# Patient Record
Sex: Female | Born: 1965
Health system: Southern US, Community
[De-identification: ages and names within clinical notes are randomized; demographics above are authoritative.]

## PROBLEM LIST (undated history)

## (undated) DIAGNOSIS — E559 Vitamin D deficiency, unspecified: Secondary | ICD-10-CM

## (undated) DIAGNOSIS — K219 Gastro-esophageal reflux disease without esophagitis: Secondary | ICD-10-CM

## (undated) DIAGNOSIS — E7800 Pure hypercholesterolemia, unspecified: Secondary | ICD-10-CM

## (undated) DIAGNOSIS — K649 Unspecified hemorrhoids: Secondary | ICD-10-CM

## (undated) HISTORY — DX: Pure hypercholesterolemia, unspecified: E78.00

## (undated) HISTORY — PX: COLONOSCOPY: SHX174

## (undated) HISTORY — PX: WISDOM TOOTH EXTRACTION: SHX21

## (undated) HISTORY — DX: Unspecified hemorrhoids: K64.9

## (undated) HISTORY — DX: Vitamin D deficiency, unspecified: E55.9

## (undated) HISTORY — DX: Gastro-esophageal reflux disease without esophagitis: K21.9

---

## 1988-12-21 HISTORY — PX: CERVICAL ABLATION: SHX5771

## 2001-04-20 HISTORY — PX: TUBAL LIGATION: SHX77

## 2015-01-02 ENCOUNTER — Other Ambulatory Visit: Payer: Self-pay

## 2015-01-02 DIAGNOSIS — Z1231 Encounter for screening mammogram for malignant neoplasm of breast: Secondary | ICD-10-CM

## 2015-01-10 ENCOUNTER — Ambulatory Visit
Admission: RE | Admit: 2015-01-10 | Discharge: 2015-01-10 | Disposition: A | Discharge status: Home / Self Care | Payer: BLUE CROSS/BLUE SHIELD | Source: Ambulatory Visit

## 2015-01-10 DIAGNOSIS — Z1231 Encounter for screening mammogram for malignant neoplasm of breast: Secondary | ICD-10-CM

## 2015-01-21 HISTORY — PX: ENDOMETRIAL ABLATION: SHX621

## 2016-07-17 ENCOUNTER — Telehealth: Payer: Self-pay

## 2016-07-17 NOTE — Telephone Encounter (Signed)
Pre VIsit call completed with patient. 

## 2016-07-20 ENCOUNTER — Ambulatory Visit (INDEPENDENT_AMBULATORY_CARE_PROVIDER_SITE_OTHER): Payer: BLUE CROSS/BLUE SHIELD | Admitting: Family Medicine

## 2016-07-20 ENCOUNTER — Encounter: Payer: Self-pay | Admitting: Family Medicine

## 2016-07-20 ENCOUNTER — Other Ambulatory Visit (HOSPITAL_COMMUNITY)
Admission: RE | Admit: 2016-07-20 | Discharge: 2016-07-20 | Disposition: A | Discharge status: Home / Self Care | Payer: BLUE CROSS/BLUE SHIELD | Source: Ambulatory Visit | Attending: Family Medicine | Admitting: Family Medicine

## 2016-07-20 VITALS — BP 121/83 | HR 73 | Temp 97.9°F | Ht 64.25 in | Wt 205.8 lb

## 2016-07-20 DIAGNOSIS — N926 Irregular menstruation, unspecified: Secondary | ICD-10-CM

## 2016-07-20 DIAGNOSIS — Z0000 Encounter for general adult medical examination without abnormal findings: Secondary | ICD-10-CM

## 2016-07-20 DIAGNOSIS — Z01419 Encounter for gynecological examination (general) (routine) without abnormal findings: Secondary | ICD-10-CM | POA: Diagnosis present

## 2016-07-20 DIAGNOSIS — Z131 Encounter for screening for diabetes mellitus: Secondary | ICD-10-CM | POA: Diagnosis not present

## 2016-07-20 DIAGNOSIS — Z13220 Encounter for screening for lipoid disorders: Secondary | ICD-10-CM | POA: Diagnosis not present

## 2016-07-20 DIAGNOSIS — Z124 Encounter for screening for malignant neoplasm of cervix: Secondary | ICD-10-CM

## 2016-07-20 DIAGNOSIS — Z1329 Encounter for screening for other suspected endocrine disorder: Secondary | ICD-10-CM

## 2016-07-20 DIAGNOSIS — Z1151 Encounter for screening for human papillomavirus (HPV): Secondary | ICD-10-CM | POA: Diagnosis present

## 2016-07-20 DIAGNOSIS — Z23 Encounter for immunization: Secondary | ICD-10-CM

## 2016-07-20 DIAGNOSIS — E559 Vitamin D deficiency, unspecified: Secondary | ICD-10-CM | POA: Diagnosis not present

## 2016-07-20 DIAGNOSIS — Z130 Encounter for screening for diseases of the blood and blood-forming organs and certain disorders involving the immune mechanism: Secondary | ICD-10-CM | POA: Diagnosis not present

## 2016-07-20 NOTE — Patient Instructions (Addendum)
It was great to see you today- I will be in touch with your labs and pap asap Your blood pressure looks fine If you like, you can have a mammogram done at the imaging department on the ground floor of the medcenter at your convenience.   Next year you will be due for a screening colonoscopy   If you can, work on losing a few pounds through diet and exercise

## 2016-07-20 NOTE — Progress Notes (Signed)
Pre visit review using our clinic review tool, if applicable. No additional management support is needed unless otherwise documented below in the visit note. 

## 2016-07-20 NOTE — Progress Notes (Signed)
[place] at [facility] [address], [place], [place] [address] [phone]  Date:  07/20/2016   Name:  Rebecca Levy   DOB:  12/14/1966   MRN:  [id number]  PCP:  Rebecca Blinks, MD    Chief Complaint: Establish Care (Pt here to est care. )   History of Present Illness:  Rebecca Levy is a 50 y.o. very pleasant female patient who presents with the following:  Here today to establish care, and also to do a pap.  She had an ablation last year- this has decreased her bleeding although she still does have some light menstrual bleeding.    Last pap was over a year ago.  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  She does not know if she had tdap or Td.  Would like to have tdap today Her last mammo was 12/2014- normal.  Last labs over a year ago.   She is fasting today for labs  History of vitamin D deficiency; she is taking a supplement and would like to check this level She also would like to screen for menopause with an [place] level today   LMP 7/15 There are no active problems to display for this patient.   Past Medical History:  Diagnosis Date  . GERD (gastroesophageal reflux disease)   . High cholesterol   . Vitamin D deficiency     Past Surgical History:  Procedure Laterality Date  . ENDOMETRIAL ABLATION  01/21/2015    Social History  Substance Use Topics  . Smoking status: Not on file  . Smokeless tobacco: Never Used  . Alcohol use Yes     Comment: occ     No family history on file.  No Known Allergies  Medication list has been reviewed and updated.  Current Outpatient Prescriptions on File Prior to Visit  Medication Sig Dispense Refill  . VITAMIN D, ERGOCALCIFEROL, PO Take 5,000 mg by mouth daily.     No current facility-administered medications on file prior to visit.     Review of Systems:  .asepr   Physical Examination: Vitals:   07/20/16 1522  BP: 121/83  Pulse: 73  Temp: 97.9 F (36.6 C)   Vitals:   07/20/16 1522  Weight: 205 lb 12.8 oz (93.4 kg)  Height: 5' 4.25" (1.632 m)   Body mass index is 35.05 kg/m. Ideal Body Weight: Weight in (lb) to have BMI = 25: 146.5  GEN: WDWN, NAD, Non-toxic, A & O x 3, obese, OW looks well HEENT: Atraumatic, Normocephalic. Neck supple. No masses, No LAD.  Bilateral TM wnl, oropharynx normal.  PEERL,EOMI.   Ears and Nose: No external deformity. CV: RRR, No M/G/R. No JVD. No thrill. No extra heart sounds. PULM: CTA B, no wheezes, crackles, rhonchi. No retractions. No resp. distress. No accessory muscle use. ABD: S, NT, ND. No rebound. No HSM. EXTR: No c/c/e NEURO Normal gait.  PSYCH: Normally interactive. Conversant. Not depressed or anxious appearing.  Calm demeanor.  Breast: normal exam, no masses/ dimpling/ discharge Pelvic: normal, no vaginal lesions or discharge. Uterus normal, no CMT, no adnexal tendereness or masses    Assessment and Plan: Physical exam  Screening for cervical cancer - Plan: Cytology - PAP  Screening for deficiency anemia - Plan: CBC  Vitamin D deficiency - Plan: Vitamin D (25 hydroxy)  Screening for diabetes mellitus - Plan: Comprehensive metabolic  panel, Hemoglobin A1c  Menstrual irregularity - Plan: [facility]  Immunization due - Plan: Tdap vaccine greater than or equal to 7yo IM  Screening for thyroid disorder - Plan: TSH  Screening for hyperlipidemia - Plan: Lipid panel  Here today as a new visit for a CPE, pap and labs Update tdap She is overweight but OW looks well today, BP is fine Will plan further follow- up pending labs.    Signed Rebecca Blinks, MD

## 2016-07-21 LAB — COMPREHENSIVE METABOLIC PANEL
ALK PHOS: 65 U/L (ref 39–117)
ALT: 17 U/L (ref 0–35)
AST: 15 U/L (ref 0–37)
Albumin: 4.1 g/dL (ref 3.5–5.2)
BILIRUBIN TOTAL: 1 mg/dL (ref 0.2–1.2)
BUN: 11 mg/dL (ref 6–23)
CALCIUM: 10.6 mg/dL — AB (ref 8.4–10.5)
CO2: 26 meq/L (ref 19–32)
Chloride: 103 mEq/L (ref 96–112)
Creatinine, Ser: 0.8 mg/dL (ref 0.40–1.20)
GFR: 97.72 mL/min (ref >60.00)
Glucose, Bld: 82 mg/dL (ref 70–99)
Potassium: 4 mEq/L (ref 3.5–5.1)
Sodium: 135 mEq/L (ref 135–145)
Total Protein: 7.6 g/dL (ref 6.0–8.3)

## 2016-07-21 LAB — CBC
HCT: 37 % (ref 36.0–46.0)
Hemoglobin: 12.4 g/dL (ref 12.0–15.0)
MCHC: 33.5 g/dL (ref 30.0–36.0)
MCV: 90.6 fl (ref 78.0–100.0)
PLATELETS: 306 10*3/uL (ref 150.0–400.0)
RBC: 4.08 Mil/uL (ref 3.87–5.11)
RDW: 12.9 % (ref 11.5–15.5)
WBC: 8.3 10*3/uL (ref 4.0–10.5)

## 2016-07-21 LAB — FOLLICLE STIMULATING HORMONE: FSH: 15.5 m[IU]/mL

## 2016-07-21 LAB — LIPID PANEL
CHOL/HDL RATIO: 4
Cholesterol: 204 mg/dL — ABNORMAL HIGH (ref 0–200)
HDL: 47.6 mg/dL (ref >39.00)
LDL Cholesterol: 123 mg/dL — ABNORMAL HIGH (ref 0–99)
NonHDL: 156
Triglycerides: 164 mg/dL — ABNORMAL HIGH (ref 0.0–149.0)
VLDL: 32.8 mg/dL (ref 0.0–40.0)

## 2016-07-21 LAB — VITAMIN D 25 HYDROXY (VIT D DEFICIENCY, FRACTURES): VITD: 33.42 ng/mL (ref 30.00–100.00)

## 2016-07-21 LAB — TSH: TSH: 1.66 u[IU]/mL (ref 0.35–4.50)

## 2016-07-21 LAB — HEMOGLOBIN A1C: Hgb A1c MFr Bld: 5.1 % (ref 4.6–6.5)

## 2016-07-22 ENCOUNTER — Encounter: Payer: Self-pay | Admitting: Family Medicine

## 2016-07-22 LAB — CYTOLOGY - PAP

## 2016-12-30 ENCOUNTER — Telehealth: Payer: Self-pay | Admitting: Family Medicine

## 2016-12-30 ENCOUNTER — Other Ambulatory Visit: Payer: Self-pay | Admitting: Family Medicine

## 2016-12-30 DIAGNOSIS — Z1211 Encounter for screening for malignant neoplasm of colon: Secondary | ICD-10-CM

## 2016-12-30 DIAGNOSIS — Z1231 Encounter for screening mammogram for malignant neoplasm of breast: Secondary | ICD-10-CM

## 2016-12-30 NOTE — Telephone Encounter (Signed)
Patient called requesting a referral to a gastroenterologist to et her colonoscopy done. Please advise  Phone: [phone]

## 2016-12-30 NOTE — Telephone Encounter (Signed)
Referral sent 

## 2016-12-30 NOTE — Telephone Encounter (Signed)
Patient aware.

## 2017-01-08 ENCOUNTER — Telehealth: Payer: Self-pay | Admitting: Gastroenterology

## 2017-01-18 ENCOUNTER — Encounter: Payer: Self-pay | Admitting: Gastroenterology

## 2017-01-18 NOTE — Telephone Encounter (Signed)
LM on Vmail to call back to schedule colonoscopy.  Records reviewed and in records reviewed folder

## 2017-02-04 ENCOUNTER — Ambulatory Visit
Admission: RE | Admit: 2017-02-04 | Discharge: 2017-02-04 | Disposition: A | Discharge status: Home / Self Care | Payer: BLUE CROSS/BLUE SHIELD | Source: Ambulatory Visit | Attending: Family Medicine | Admitting: Family Medicine

## 2017-02-04 DIAGNOSIS — Z1231 Encounter for screening mammogram for malignant neoplasm of breast: Secondary | ICD-10-CM

## 2017-02-09 ENCOUNTER — Ambulatory Visit (AMBULATORY_SURGERY_CENTER): Payer: Self-pay

## 2017-02-09 VITALS — Ht 64.5 in | Wt 209.2 lb

## 2017-02-09 DIAGNOSIS — Z800 Family history of malignant neoplasm of digestive organs: Secondary | ICD-10-CM

## 2017-02-09 MED ORDER — NA SULFATE-K SULFATE-MG SULF 17.5-3.13-1.6 GM/177ML PO SOLN: ORAL | 0 refills | Status: DC

## 2017-02-09 NOTE — Progress Notes (Signed)
Per pt, no allergies to soy or egg products.Pt not taking any weight loss meds or using  O2 at home. 

## 2017-02-10 ENCOUNTER — Encounter: Payer: Self-pay | Admitting: Gastroenterology

## 2017-02-15 ENCOUNTER — Telehealth: Payer: Self-pay | Admitting: Gastroenterology

## 2017-02-15 NOTE — Telephone Encounter (Signed)
Called pt with Miralax prep instructions and mailed pt copy [name]/PV

## 2017-02-18 ENCOUNTER — Ambulatory Visit (AMBULATORY_SURGERY_CENTER): Payer: BLUE CROSS/BLUE SHIELD | Admitting: Gastroenterology

## 2017-02-18 ENCOUNTER — Encounter: Payer: Self-pay | Admitting: Gastroenterology

## 2017-02-18 VITALS — BP 133/89 | HR 66 | Temp 98.7°F | Resp 16 | Ht 64.5 in | Wt 209.0 lb

## 2017-02-18 DIAGNOSIS — Z1212 Encounter for screening for malignant neoplasm of rectum: Secondary | ICD-10-CM | POA: Diagnosis not present

## 2017-02-18 DIAGNOSIS — Z1211 Encounter for screening for malignant neoplasm of colon: Secondary | ICD-10-CM

## 2017-02-18 DIAGNOSIS — Z800 Family history of malignant neoplasm of digestive organs: Secondary | ICD-10-CM

## 2017-02-18 DIAGNOSIS — D123 Benign neoplasm of transverse colon: Secondary | ICD-10-CM | POA: Diagnosis not present

## 2017-02-18 MED ORDER — SODIUM CHLORIDE 0.9 % IV SOLN: 500.0000 mL | INTRAVENOUS | Status: DC

## 2017-02-18 NOTE — Progress Notes (Signed)
  [facility] Post-op Note  Patient: Rebecca Levy  Procedure(s) Performed: colonoscopy  Patient Location: [facility] - Recovery Area  Anesthesia Type: Deep Sedation/Propofol  Level of Consciousness: awake, oriented and patient cooperative  Airway and Oxygen Therapy: Patient Spontanous Breathing  Post-op Pain: none  Post-op Assessment:  Post-op Vital signs reviewed, Patient's Cardiovascular Status Stable, Respiratory Function Stable, Patent Airway, No signs of Nausea or vomiting and Pain level controlled  Post-op Vital Signs: Reviewed and stable  Complications: No apparent anesthesia complications  Oluwatobiloba Martin E 1:13 PM

## 2017-02-18 NOTE — Op Note (Signed)
[facility] Patient Name: Rebecca Levy Procedure Date: 02/18/2017 12:39 PM MRN: [id number] Endoscopist: Mauri Pole , MD Age: 51 Referring MD:  Date of Birth: 1966/09/04 Gender: Female Account #: [id number] Procedure:                Colonoscopy Indications:              Screening in patient at increased risk: Colorectal                            cancer in father before age 51, Screening in                            patient at increased risk: Colorectal cancer in                            sister before age 42 Medicines:                Monitored Anesthesia Care Procedure:                Pre-Anesthesia Assessment:                           - Prior to the procedure, a History and Physical                            was performed, and patient medications and                            allergies were reviewed. The patient's tolerance of                            previous anesthesia was also reviewed. The risks                            and benefits of the procedure and the sedation                            options and risks were discussed with the patient.                            All questions were answered, and informed consent                            was obtained. Prior Anticoagulants: The patient has                            taken no previous anticoagulant or antiplatelet                            agents. ASA Grade Assessment: II - A patient with                            mild systemic disease. After reviewing the risks  and benefits, the patient was deemed in                            satisfactory condition to undergo the procedure.                           After obtaining informed consent, the colonoscope                            was passed under direct vision. Throughout the                            procedure, the patient's blood pressure, pulse, and                            oxygen saturations were monitored  continuously. The                            Model CF-HQ190L [id number]) scope was introduced                            through the anus and advanced to the the cecum,                            identified by appendiceal orifice and ileocecal                            valve. The colonoscopy was performed without                            difficulty. The patient tolerated the procedure                            well. The quality of the bowel preparation was                            excellent. The ileocecal valve, appendiceal                            orifice, and rectum were photographed. Scope In: 12:43:42 PM Scope Out: 1:06:37 PM Scope Withdrawal Time: 0 hours 15 minutes 9 seconds  Total Procedure Duration: 0 hours 22 minutes 55 seconds  Findings:                 The perianal and digital rectal examinations were                            normal.                           A 15 mm polyp was found in the transverse colon.                            The polyp was semi-pedunculated. The polyp was  removed with a hot snare. Resection and retrieval                            were complete.                           The exam was otherwise without abnormality. Complications:            No immediate complications. Estimated Blood Loss:     Estimated blood loss: none. Impression:               - One 15 mm polyp in the transverse colon, removed                            with a hot snare. Resected and retrieved.                           - The examination was otherwise normal. Recommendation:           - Patient has a contact number available for                            emergencies. The signs and symptoms of potential                            delayed complications were discussed with the                            patient. Return to normal activities tomorrow.                            Written discharge instructions were provided to the                             patient.                           - Resume previous diet.                           - Continue present medications.                           - No aspirin, ibuprofen, naproxen, or other                            non-steroidal anti-inflammatory drugs.                           - Await pathology results.                           - Repeat colonoscopy in 3 years for surveillance                            based on pathology results.                           -  Return to GI clinic PRN. Mauri Pole, MD 02/18/2017 1:12:11 PM This report has been signed electronically.

## 2017-02-18 NOTE — Progress Notes (Signed)
Called to room to assist during endoscopic procedure.  Patient ID and intended procedure confirmed with present staff. Received instructions for my participation in the procedure from the performing physician.  

## 2017-02-18 NOTE — Patient Instructions (Signed)
  No aspirin, ibuprofen, advil, aleve, naprosyn, any NSAID medication  Handout given for Polyps   YOU HAD AN ENDOSCOPIC PROCEDURE TODAY AT [facility]:   Refer to the procedure report that was given to you for any specific questions about what was found during the examination.  If the procedure report does not answer your questions, please call your gastroenterologist to clarify.  If you requested that your care partner not be given the details of your procedure findings, then the procedure report has been included in a sealed envelope for you to review at your convenience later.  YOU SHOULD EXPECT: Some feelings of bloating in the abdomen. Passage of more gas than usual.  Walking can help get rid of the air that was put into your GI tract during the procedure and reduce the bloating. If you had a lower endoscopy (such as a colonoscopy or flexible sigmoidoscopy) you may notice spotting of blood in your stool or on the toilet paper. If you underwent a bowel prep for your procedure, you may not have a normal bowel movement for a few days.  Please Note:  You might notice some irritation and congestion in your nose or some drainage.  This is from the oxygen used during your procedure.  There is no need for concern and it should clear up in a day or so.  SYMPTOMS TO REPORT IMMEDIATELY:   Following lower endoscopy (colonoscopy or flexible sigmoidoscopy):  Excessive amounts of blood in the stool  Significant tenderness or worsening of abdominal pains  Swelling of the abdomen that is new, acute  Fever of 100F or higher   For urgent or emergent issues, a gastroenterologist can be reached at any hour by calling [phone].   DIET:  We do recommend a small meal at first, but then you may proceed to your regular diet.  Drink plenty of fluids but you should avoid alcoholic beverages for 24 hours.  ACTIVITY:  You should plan to take it easy for the rest of today and you should NOT  DRIVE or use heavy machinery until tomorrow (because of the sedation medicines used during the test).    FOLLOW UP: Our staff will call the number listed on your records the next business day following your procedure to check on you and address any questions or concerns that you may have regarding the information given to you following your procedure. If we do not reach you, we will leave a message.  However, if you are feeling well and you are not experiencing any problems, there is no need to return our call.  We will assume that you have returned to your regular daily activities without incident.  If any biopsies were taken you will be contacted by phone or by letter within the next 1-3 weeks.  Please call us at [phone] if you have not heard about the biopsies in 3 weeks.    SIGNATURES/CONFIDENTIALITY: You and/or your care partner have signed paperwork which will be entered into your electronic medical record.  These signatures attest to the fact that that the information above on your After Visit Summary has been reviewed and is understood.  Full responsibility of the confidentiality of this discharge information lies with you and/or your care-partner.

## 2017-02-19 ENCOUNTER — Telehealth: Payer: Self-pay | Admitting: *Deleted

## 2017-02-19 NOTE — Telephone Encounter (Signed)
  Follow up Call-  Call back number 02/18/2017  Post procedure Call Back phone  # [phone]  Permission to leave phone message Yes     Patient questions:  Do you have a fever, pain , or abdominal swelling? No. Pain Score  0 *  Have you tolerated food without any problems? Yes.    Have you been able to return to your normal activities? Yes.    Do you have any questions about your discharge instructions: Diet   No. Medications  No. Follow up visit  No.  Do you have questions or concerns about your Care? No.  Actions: * If pain score is 4 or above: No action needed, pain <4.

## 2017-02-25 ENCOUNTER — Encounter: Payer: Self-pay | Admitting: Gastroenterology

## 2017-04-29 ENCOUNTER — Ambulatory Visit (HOSPITAL_BASED_OUTPATIENT_CLINIC_OR_DEPARTMENT_OTHER)
Admission: RE | Admit: 2017-04-29 | Discharge: 2017-04-29 | Disposition: A | Discharge status: Home / Self Care | Payer: BLUE CROSS/BLUE SHIELD | Source: Ambulatory Visit | Attending: Family Medicine | Admitting: Family Medicine

## 2017-04-29 ENCOUNTER — Ambulatory Visit (INDEPENDENT_AMBULATORY_CARE_PROVIDER_SITE_OTHER): Payer: BLUE CROSS/BLUE SHIELD | Admitting: Family Medicine

## 2017-04-29 VITALS — BP 126/86 | HR 76 | Temp 97.8°F | Ht 64.25 in | Wt 213.6 lb

## 2017-04-29 DIAGNOSIS — Z130 Encounter for screening for diseases of the blood and blood-forming organs and certain disorders involving the immune mechanism: Secondary | ICD-10-CM

## 2017-04-29 DIAGNOSIS — Z131 Encounter for screening for diabetes mellitus: Secondary | ICD-10-CM | POA: Diagnosis not present

## 2017-04-29 DIAGNOSIS — M545 Low back pain: Secondary | ICD-10-CM | POA: Insufficient documentation

## 2017-04-29 DIAGNOSIS — M47816 Spondylosis without myelopathy or radiculopathy, lumbar region: Secondary | ICD-10-CM | POA: Insufficient documentation

## 2017-04-29 DIAGNOSIS — Z1329 Encounter for screening for other suspected endocrine disorder: Secondary | ICD-10-CM

## 2017-04-29 DIAGNOSIS — Z13220 Encounter for screening for lipoid disorders: Secondary | ICD-10-CM

## 2017-04-29 DIAGNOSIS — N912 Amenorrhea, unspecified: Secondary | ICD-10-CM

## 2017-04-29 LAB — LIPID PANEL
CHOL/HDL RATIO: 5
Cholesterol: 186 mg/dL (ref 0–200)
HDL: 39.1 mg/dL (ref >39.00)
LDL Cholesterol: 110 mg/dL — ABNORMAL HIGH (ref 0–99)
NONHDL: 146.89
TRIGLYCERIDES: 182 mg/dL — AB (ref 0.0–149.0)
VLDL: 36.4 mg/dL (ref 0.0–40.0)

## 2017-04-29 LAB — POC URINALSYSI DIPSTICK (AUTOMATED)
BILIRUBIN UA: NEGATIVE
GLUCOSE UA: NEGATIVE
Ketones, UA: NEGATIVE
Leukocytes, UA: NEGATIVE
Nitrite, UA: NEGATIVE
Protein, UA: NEGATIVE
RBC UA: NEGATIVE
Spec Grav, UA: 1.015 (ref 1.010–1.025)
Urobilinogen, UA: 0.2 E.U./dL
pH, UA: 6 (ref 5.0–8.0)

## 2017-04-29 LAB — COMPREHENSIVE METABOLIC PANEL
ALK PHOS: 67 U/L (ref 39–117)
ALT: 37 U/L — ABNORMAL HIGH (ref 0–35)
AST: 25 U/L (ref 0–37)
Albumin: 3.8 g/dL (ref 3.5–5.2)
BILIRUBIN TOTAL: 0.7 mg/dL (ref 0.2–1.2)
BUN: 13 mg/dL (ref 6–23)
CO2: 28 mEq/L (ref 19–32)
CREATININE: 0.72 mg/dL (ref 0.40–1.20)
Calcium: 10.7 mg/dL — ABNORMAL HIGH (ref 8.4–10.5)
Chloride: 105 mEq/L (ref 96–112)
GFR: 110.01 mL/min (ref >60.00)
GLUCOSE: 81 mg/dL (ref 70–99)
Potassium: 4.1 mEq/L (ref 3.5–5.1)
Sodium: 136 mEq/L (ref 135–145)
Total Protein: 6.8 g/dL (ref 6.0–8.3)

## 2017-04-29 LAB — CBC
HCT: 33.3 % — ABNORMAL LOW (ref 36.0–46.0)
Hemoglobin: 11 g/dL — ABNORMAL LOW (ref 12.0–15.0)
MCHC: 33.1 g/dL (ref 30.0–36.0)
MCV: 93.2 fl (ref 78.0–100.0)
Platelets: 305 10*3/uL (ref 150.0–400.0)
RBC: 3.58 Mil/uL — ABNORMAL LOW (ref 3.87–5.11)
RDW: 13.9 % (ref 11.5–15.5)
WBC: 8.5 10*3/uL (ref 4.0–10.5)

## 2017-04-29 LAB — HEMOGLOBIN A1C: HEMOGLOBIN A1C: 5.2 % (ref 4.6–6.5)

## 2017-04-29 LAB — TSH: TSH: 1.62 u[IU]/mL (ref 0.35–4.50)

## 2017-04-29 LAB — POCT URINE PREGNANCY: PREG TEST UR: NEGATIVE

## 2017-04-29 MED ORDER — METHOCARBAMOL 500 MG PO TABS: 500.0000 mg | ORAL_TABLET | Freq: Three times a day (TID) | ORAL | 0 refills | Status: DC | PRN

## 2017-04-29 NOTE — Progress Notes (Addendum)
Rebecca Levy at [facility] [address] [place], [place] [address] [phone]  Date:  04/29/2017   Name:  Rebecca Levy   DOB:  04/29/1966   MRN:  [id number]  PCP:  Darreld Mclean, MD    Chief Complaint: Back Pain (c/o lower back pain that is noticed more with certain movements x 1 month and is has been getting worse over the last few days. Pt also noticed she has had increased urination for the past month. )   History of Present Illness:  Rebecca Levy is a 51 y.o. very pleasant female patient who presents with the following:  Here today for an acute visit- last seen by myself in July of 2017  Here today to establish care, and also to do a pap.  She had an ablation last year- this has decreased her bleeding although she still does have some light menstrual bleeding.    Last pap was over a year ago.  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  She does not know if she had tdap or Td.  Would like to have tdap today Her last mammo was 12/2014- normal.  Last labs over a year ago.   She is fasting today for labs  History of vitamin D deficiency; she is taking a supplement and would like to check this level She also would like to screen for menopause with an [place] level today  Today she notes a problem with lower back pain for about one month.  It has been worse for the last week or so. She works as a CNA/ medication aid.  Her job is physically active but is not aware of any particular injury. They got a new medicaiton cart which is heavier than the old one so this may have contributed. The pain can radiatte into her left thigh and buttock a bit.  No leg weakness or numbness of her legs. She has tried ibuprofen/ tylenol as needed but does not think it helps too much She has never had any significant back issues in the past   She has not noted any bowel or bladder control  issues She has noted more frequent urination for a month or so however  No menses in about 6 months- she wonders if she is going through menopause She did have an ablation about 2 years ago but continued to menstruate until 6 months ago   There are no active problems to display for this patient.   Past Medical History:  Diagnosis Date  . GERD (gastroesophageal reflux disease)   . Hemorrhoids   . High cholesterol   . Vitamin D deficiency     Past Surgical History:  Procedure Laterality Date  . CERVICAL ABLATION  1990  . COLONOSCOPY    . ENDOMETRIAL ABLATION  01/21/2015  . TUBAL LIGATION  04/20/2001  . WISDOM TOOTH EXTRACTION      Social History  Substance Use Topics  . Smoking status: Never Smoker  . Smokeless tobacco: Never Used  . Alcohol use Yes     Comment: occ     Family History  Problem Relation Age of Onset  . Colon cancer Father        died age 26 due to colon cancer  . Hypertension Father   . Hyperlipidemia Father   . Colon cancer Sister  died age 37 due to colon cancer  . Ovarian cancer Maternal Aunt   . Breast cancer Maternal Aunt   . Breast cancer Maternal Aunt     No Known Allergies  Medication list has been reviewed and updated.  Current Outpatient Prescriptions on File Prior to Visit  Medication Sig Dispense Refill  . Omega-3 Fatty Acids (FISH OIL) 1000 MG CAPS Take by mouth daily.    Marland Kitchen VITAMIN D, ERGOCALCIFEROL, PO Take 1,000 mg by mouth daily.      Current Facility-Administered Medications on File Prior to Visit  Medication Dose Route Frequency Provider Last Rate Last Dose  . 0.9 %  sodium chloride infusion  500 mL Intravenous Continuous Nandigam, Venia Minks, MD        Review of Systems:  As per HPI- otherwise negative.   Physical Examination: Vitals:   04/29/17 1009  BP: 126/86  Pulse: 76  Temp: 97.8 F (36.6 C)   Vitals:   04/29/17 1009  Weight: 213 lb 9.6 oz (96.9 kg)  Height: 5' 4.25" (1.632 m)   Body mass index  is 36.38 kg/m. Ideal Body Weight: Weight in (lb) to have BMI = 25: 146.5  GEN: WDWN, NAD, Non-toxic, A & O x 3, obese, otherwise looks well HEENT: Atraumatic, Normocephalic. Neck supple. No masses, No LAD. Ears and Nose: No external deformity. CV: RRR, No M/G/R. No JVD. No thrill. No extra heart sounds. PULM: CTA B, no wheezes, crackles, rhonchi. No retractions. No resp. distress. No accessory muscle use. EXTR: No c/c/e NEURO Normal gait.  PSYCH: Normally interactive. Conversant. Not depressed or anxious appearing.  Calm demeanor.  She indicates pain in her bilateral lower back.  Normal BLE strength, sensation, DTR and negative SLR.  However lumbar flexion is slightly decreased, extension is normal   Results for orders placed or performed in visit on 04/29/17  CBC  Result Value Ref Range   WBC 8.5 4.0 - 10.5 K/uL   RBC 3.58 (L) 3.87 - 5.11 Mil/uL   Platelets 305.0 150.0 - 400.0 K/uL   Hemoglobin 11.0 (L) 12.0 - 15.0 g/dL   HCT 33.3 (L) 36.0 - 46.0 %   MCV 93.2 78.0 - 100.0 fl   MCHC 33.1 30.0 - 36.0 g/dL   RDW 13.9 11.5 - 15.5 %  Comprehensive metabolic panel  Result Value Ref Range   Sodium 136 135 - 145 mEq/L   Potassium 4.1 3.5 - 5.1 mEq/L   Chloride 105 96 - 112 mEq/L   CO2 28 19 - 32 mEq/L   Glucose, Bld 81 70 - 99 mg/dL   BUN 13 6 - 23 mg/dL   Creatinine, Ser 0.72 0.40 - 1.20 mg/dL   Total Bilirubin 0.7 0.2 - 1.2 mg/dL   Alkaline Phosphatase 67 39 - 117 U/L   AST 25 0 - 37 U/L   ALT 37 (H) 0 - 35 U/L   Total Protein 6.8 6.0 - 8.3 g/dL   Albumin 3.8 3.5 - 5.2 g/dL   Calcium 10.7 (H) 8.4 - 10.5 mg/dL   GFR 110.01 >60.00 mL/min  TSH  Result Value Ref Range   TSH 1.62 0.35 - 4.50 uIU/mL  Lipid panel  Result Value Ref Range   Cholesterol 186 0 - 200 mg/dL   Triglycerides 182.0 (H) 0.0 - 149.0 mg/dL   HDL 39.10 >39.00 mg/dL   VLDL 36.4 0.0 - 40.0 mg/dL   LDL Cholesterol 110 (H) 0 - 99 mg/dL   Total CHOL/HDL Ratio 5  NonHDL 146.89   Hemoglobin A1c  Result Value  Ref Range   Hgb A1c MFr Bld 5.2 4.6 - 6.5 %  POCT Urinalysis Dipstick (Automated)  Result Value Ref Range   Color, UA Yellow    Clarity, UA Clear    Glucose, UA negative    Bilirubin, UA negative    Ketones, UA negative    Spec Grav, UA 1.015 1.010 - 1.025   Blood, UA negative    pH, UA 6.0 5.0 - 8.0   Protein, UA negative    Urobilinogen, UA 0.2 0.2 or 1.0 E.U./dL   Nitrite, UA negative    Leukocytes, UA Negative Negative  POCT urine pregnancy  Result Value Ref Range   Preg Test, Ur Negative Negative    Assessment and Plan: Acute low back pain, unspecified back pain laterality, with sciatica presence unspecified - Plan: POCT Urinalysis Dipstick (Automated), DG Lumbar Spine Complete, methocarbamol (ROBAXIN) 500 MG tablet  Amenorrhea - Plan: POCT urine pregnancy  Screening for thyroid disorder - Plan: TSH  Screening for diabetes mellitus - Plan: Comprehensive metabolic panel, Hemoglobin A1c  Screening for hyperlipidemia - Plan: Lipid panel  Screening for deficiency anemia - Plan: CBC  Here today with lower back pain . Likely due to overuse and deconditioning.  Encouraged her to work more exercise into her life, esp core strength and she will try Note to be OOW for a few days, robaxin to use as needed She is fasting today so will obtain labs as above She is seeing me for a CPE in a couple of months  Her lumbar film reports are not up yet, but are benign to my read    Signed Lamar Blinks, MD  Received labs- letter to pt, ordered PTH and Calcium level as a future lab for her

## 2017-04-29 NOTE — Patient Instructions (Addendum)
It was good to see you today- we will get your labs for you today so you do NOT need to fast prior to your physical later this summer I will be in touch with your x-ray report asap In the meantime, try the robaxin (muscle relaxer) as needed for your back pain.  This may cause you to feel sleepy so keep this in mind!  Also, you can continue to use ibuprofen/ tylenol as needed  Let me know if your back is not feeling better soon!  Work on getting into shape and strengthening your back and core- this will help keep your back from hurting as you get older

## 2017-04-29 NOTE — Addendum Note (Signed)
Addended by: Lamar Blinks C on: 04/29/2017 09:19 PM   Modules accepted: Orders

## 2017-05-06 ENCOUNTER — Telehealth: Payer: Self-pay | Admitting: Family Medicine

## 2017-05-06 NOTE — Telephone Encounter (Signed)
Labs and x-rays mailed to pt several days ago Called and [facility]- results should arrive soon, let me know if not. Signing up for mychart is the fastest way to get labs

## 2017-05-06 NOTE — Telephone Encounter (Signed)
Relation to ZJ:QBHA Call back number:[phone]  Reason for call:  Patient inquiring about imaging/lab results,please advise

## 2017-07-20 NOTE — Progress Notes (Addendum)
[facility] [address], [place], [place] [address] [phone]  Date:  07/22/2017   Name:  Rebecca Levy   DOB:  02-Mar-1966   MRN:  [id number]  PCP:  Darreld Mclean, MD    Chief Complaint: Annual Exam (Pt here for CPE. Last PAP: 06/2016, pt would like to have a PAP today. Last mammogram: 01/2017. Up to date on tetanus vaccine. )   History of Present Illness:  Rebecca Levy is a 51 y.o. very pleasant female patient who presents with the following:  Here today for a CPE History of GERD, hyperlipidemia, vitamin D def currently on OTC supplement Her last pap was in 2017- pt prefers to repeat this today She is perimenopausal- last year her menses started to space out. She recently went 10 months without any menses but then had another period.  Mammogram in February colonoscopy is UTD She is feeling well overall, but is working 2 jobs and has a hard time losing weight.  She hopes to start exercising more  Wt Readings from Last 3 Encounters:  07/22/17 214 lb 12.8 oz (97.4 kg)  04/29/17 213 lb 9.6 oz (96.9 kg)  02/18/17 209 lb (94.8 kg)   We did get labs for her in May of this year- she needs to have a repeat calcium and PTH level today,and I will also repeat her blood count She does give blood on a regular basis which is probably why she can be anemic  There are no active problems to display for this patient.   Past Medical History:  Diagnosis Date  . GERD (gastroesophageal reflux disease)   . Hemorrhoids   . High cholesterol   . Vitamin D deficiency     Past Surgical History:  Procedure Laterality Date  . CERVICAL ABLATION  1990  . COLONOSCOPY    . ENDOMETRIAL ABLATION  01/21/2015  . TUBAL LIGATION  04/20/2001  . WISDOM TOOTH EXTRACTION      Social History  Substance Use Topics  . Smoking status: Never Smoker  . Smokeless tobacco: Never Used  . Alcohol use Yes     Comment: occ     Family History   Problem Relation Age of Onset  . Colon cancer Father        died age 8 due to colon cancer  . Hypertension Father   . Hyperlipidemia Father   . Colon cancer Sister        died age 61 due to colon cancer  . Ovarian cancer Maternal Aunt   . Breast cancer Maternal Aunt   . Breast cancer Maternal Aunt     No Known Allergies  Medication list has been reviewed and updated.  Current Outpatient Prescriptions on File Prior to Visit  Medication Sig Dispense Refill  . Multiple Vitamins-Minerals (CENTRUM SILVER 50+WOMEN PO) Take 1 tablet by mouth daily.    . Omega-3 Fatty Acids (FISH OIL) 1000 MG CAPS Take by mouth daily.    Marland Kitchen VITAMIN D, ERGOCALCIFEROL, PO Take 1,000 mg by mouth daily.      No current facility-administered medications on file prior to visit.     Review of Systems:  As per HPI- otherwise negative. No fever or chills No SOB or CP   Physical Examination: Vitals:   07/22/17 1059  BP: 134/74  Pulse: 70  Temp: 98.1 F (36.7 C)   Vitals:   07/22/17 1059  Weight: 214 lb 12.8 oz (97.4  kg)  Height: 5' 4.25" (1.632 m)   Body mass index is 36.58 kg/m. Ideal Body Weight: Weight in (lb) to have BMI = 25: 146.5  GEN: WDWN, NAD, Non-toxic, A & O x 3, looks well HEENT: Atraumatic, Normocephalic. Neck supple. No masses, No LAD. Bilateral TM wnl, oropharynx normal.  PEERL,EOMI.   Ears and Nose: No external deformity. CV: RRR, No M/G/R. No JVD. No thrill. No extra heart sounds. PULM: CTA B, no wheezes, crackles, rhonchi. No retractions. No resp. distress. No accessory muscle use. ABD: S, NT, ND, +BS. No rebound. No HSM. EXTR: No c/c/e NEURO Normal gait.  PSYCH: Normally interactive. Conversant. Not depressed or anxious appearing.  Calm demeanor.    Assessment and Plan: Physical exam  Screening for cervical cancer - Plan: Cytology - PAP  Serum calcium elevated - Plan: PTH, Intact and Calcium, Ambulatory referral to Endocrinology  Blood donor - Plan: CBC  Here  today for a CPE Pap today Repeat calcium and do PTH Cbc today Discussed menopause- report if any bleeding after no bleeding for a year  Signed Lamar Blinks, MD  Received her labs- called and [facility]  Results for orders placed or performed in visit on 07/22/17  CBC  Result Value Ref Range   WBC 7.3 4.0 - 10.5 K/uL   RBC 3.91 3.87 - 5.11 Mil/uL   Platelets 298.0 150.0 - 400.0 K/uL   Hemoglobin 11.7 (L) 12.0 - 15.0 g/dL   HCT 35.9 (L) 36.0 - 46.0 %   MCV 91.9 78.0 - 100.0 fl   MCHC 32.5 30.0 - 36.0 g/dL   RDW 13.5 11.5 - 15.5 %  PTH, Intact and Calcium  Result Value Ref Range   PTH 65 (H) 14 - 64 pg/mL   Calcium 10.6 (H) 8.6 - 10.4 mg/dL  Cytology - PAP  Result Value Ref Range   Adequacy      Satisfactory for evaluation  endocervical/transformation zone component ABSENT.   Diagnosis      NEGATIVE FOR INTRAEPITHELIAL LESIONS OR MALIGNANCY.   Material Submitted CervicoVaginal Pap [ThinPrep Imaged]    Her anemia is better, hb close to normal It does appear that she may have mild hyperparathyroidism- will make referral to endocrinology for her to help determine if she is truly hyperparathyroid or not  Received her pap on 8/7- letter to pt with all her labs

## 2017-07-22 ENCOUNTER — Other Ambulatory Visit (HOSPITAL_COMMUNITY)
Admission: RE | Admit: 2017-07-22 | Discharge: 2017-07-22 | Disposition: A | Discharge status: Home / Self Care | Payer: BLUE CROSS/BLUE SHIELD | Source: Ambulatory Visit | Attending: Family Medicine | Admitting: Family Medicine

## 2017-07-22 ENCOUNTER — Ambulatory Visit (INDEPENDENT_AMBULATORY_CARE_PROVIDER_SITE_OTHER): Payer: BLUE CROSS/BLUE SHIELD | Admitting: Family Medicine

## 2017-07-22 VITALS — BP 134/74 | HR 70 | Temp 98.1°F | Ht 64.25 in | Wt 214.8 lb

## 2017-07-22 DIAGNOSIS — Z0000 Encounter for general adult medical examination without abnormal findings: Secondary | ICD-10-CM

## 2017-07-22 DIAGNOSIS — Z52008 Unspecified donor, other blood: Secondary | ICD-10-CM | POA: Diagnosis not present

## 2017-07-22 DIAGNOSIS — Z01419 Encounter for gynecological examination (general) (routine) without abnormal findings: Secondary | ICD-10-CM | POA: Insufficient documentation

## 2017-07-22 DIAGNOSIS — Z124 Encounter for screening for malignant neoplasm of cervix: Secondary | ICD-10-CM | POA: Diagnosis not present

## 2017-07-22 LAB — CBC
HCT: 35.9 % — ABNORMAL LOW (ref 36.0–46.0)
HEMOGLOBIN: 11.7 g/dL — AB (ref 12.0–15.0)
MCHC: 32.5 g/dL (ref 30.0–36.0)
MCV: 91.9 fl (ref 78.0–100.0)
Platelets: 298 10*3/uL (ref 150.0–400.0)
RBC: 3.91 Mil/uL (ref 3.87–5.11)
RDW: 13.5 % (ref 11.5–15.5)
WBC: 7.3 10*3/uL (ref 4.0–10.5)

## 2017-07-22 NOTE — Patient Instructions (Signed)
It was great to see you today!  I will be in touch with your pap and labs asap - we will make sure that your calcium is back to normal   Health Maintenance for Postmenopausal Women Menopause is a normal process in which your reproductive ability comes to an end. This process happens gradually over a span of months to years, usually between the ages of 64 and 45. Menopause is complete when you have missed 12 consecutive menstrual periods. It is important to talk with your health care provider about some of the most common conditions that affect postmenopausal women, such as heart disease, cancer, and bone loss (osteoporosis). Adopting a healthy lifestyle and getting preventive care can help to promote your health and wellness. Those actions can also lower your chances of developing some of these common conditions. What should I know about menopause? During menopause, you may experience a number of symptoms, such as:  Moderate-to-severe hot flashes.  Night sweats.  Decrease in sex drive.  Mood swings.  Headaches.  Tiredness.  Irritability.  Memory problems.  Insomnia.  Choosing to treat or not to treat menopausal changes is an individual decision that you make with your health care provider. What should I know about hormone replacement therapy and supplements? Hormone therapy products are effective for treating symptoms that are associated with menopause, such as hot flashes and night sweats. Hormone replacement carries certain risks, especially as you become older. If you are thinking about using estrogen or estrogen with progestin treatments, discuss the benefits and risks with your health care provider. What should I know about heart disease and stroke? Heart disease, heart attack, and stroke become more likely as you age. This may be due, in part, to the hormonal changes that your body experiences during menopause. These can affect how your body processes dietary fats, triglycerides,  and cholesterol. Heart attack and stroke are both medical emergencies. There are many things that you can do to help prevent heart disease and stroke:  Have your blood pressure checked at least every 1-2 years. High blood pressure causes heart disease and increases the risk of stroke.  If you are 86-80 years old, ask your health care provider if you should take aspirin to prevent a heart attack or a stroke.  Do not use any tobacco products, including cigarettes, chewing tobacco, or electronic cigarettes. If you need help quitting, ask your health care provider.  It is important to eat a healthy diet and maintain a healthy weight. ? Be sure to include plenty of vegetables, fruits, low-fat dairy products, and lean protein. ? Avoid eating foods that are high in solid fats, added sugars, or salt (sodium).  Get regular exercise. This is one of the most important things that you can do for your health. ? Try to exercise for at least 150 minutes each week. The type of exercise that you do should increase your heart rate and make you sweat. This is known as moderate-intensity exercise. ? Try to do strengthening exercises at least twice each week. Do these in addition to the moderate-intensity exercise.  Know your numbers.Ask your health care provider to check your cholesterol and your blood glucose. Continue to have your blood tested as directed by your health care provider.  What should I know about cancer screening? There are several types of cancer. Take the following steps to reduce your risk and to catch any cancer development as early as possible. Breast Cancer  Practice breast self-awareness. ? This means understanding  how your breasts normally appear and feel. ? It also means doing regular breast self-exams. Let your health care provider know about any changes, no matter how small.  If you are 42 or older, have a clinician do a breast exam (clinical breast exam or CBE) every year.  Depending on your age, family history, and medical history, it may be recommended that you also have a yearly breast X-ray (mammogram).  If you have a family history of breast cancer, talk with your health care provider about genetic screening.  If you are at high risk for breast cancer, talk with your health care provider about having an MRI and a mammogram every year.  Breast cancer (BRCA) gene test is recommended for women who have family members with BRCA-related cancers. Results of the assessment will determine the need for genetic counseling and BRCA1 and for BRCA2 testing. BRCA-related cancers include these types: ? Breast. This occurs in males or females. ? Ovarian. ? Tubal. This may also be called fallopian tube cancer. ? Cancer of the abdominal or pelvic lining (peritoneal cancer). ? Prostate. ? Pancreatic.  Cervical, Uterine, and Ovarian Cancer Your health care provider may recommend that you be screened regularly for cancer of the pelvic organs. These include your ovaries, uterus, and vagina. This screening involves a pelvic exam, which includes checking for microscopic changes to the surface of your cervix (Pap test).  For women ages 21-65, health care providers may recommend a pelvic exam and a Pap test every three years. For women ages 29-65, they may recommend the Pap test and pelvic exam, combined with testing for human papilloma virus (HPV), every five years. Some types of HPV increase your risk of cervical cancer. Testing for HPV may also be done on women of any age who have unclear Pap test results.  Other health care providers may not recommend any screening for nonpregnant women who are considered low risk for pelvic cancer and have no symptoms. Ask your health care provider if a screening pelvic exam is right for you.  If you have had past treatment for cervical cancer or a condition that could lead to cancer, you need Pap tests and screening for cancer for at least 20  years after your treatment. If Pap tests have been discontinued for you, your risk factors (such as having a new sexual partner) need to be reassessed to determine if you should start having screenings again. Some women have medical problems that increase the chance of getting cervical cancer. In these cases, your health care provider may recommend that you have screening and Pap tests more often.  If you have a family history of uterine cancer or ovarian cancer, talk with your health care provider about genetic screening.  If you have vaginal bleeding after reaching menopause, tell your health care provider.  There are currently no reliable tests available to screen for ovarian cancer.  Lung Cancer Lung cancer screening is recommended for adults 101-13 years old who are at high risk for lung cancer because of a history of smoking. A yearly low-dose CT scan of the lungs is recommended if you:  Currently smoke.  Have a history of at least 30 pack-years of smoking and you currently smoke or have quit within the past 15 years. A pack-year is smoking an average of one pack of cigarettes per day for one year.  Yearly screening should:  Continue until it has been 15 years since you quit.  Stop if you develop a health problem  that would prevent you from having lung cancer treatment.  Colorectal Cancer  This type of cancer can be detected and can often be prevented.  Routine colorectal cancer screening usually begins at age 6 and continues through age 71.  If you have risk factors for colon cancer, your health care provider may recommend that you be screened at an earlier age.  If you have a family history of colorectal cancer, talk with your health care provider about genetic screening.  Your health care provider may also recommend using home test kits to check for hidden blood in your stool.  A small camera at the end of a tube can be used to examine your colon directly (sigmoidoscopy or  colonoscopy). This is done to check for the earliest forms of colorectal cancer.  Direct examination of the colon should be repeated every 5-10 years until age 40. However, if early forms of precancerous polyps or small growths are found or if you have a family history or genetic risk for colorectal cancer, you may need to be screened more often.  Skin Cancer  Check your skin from head to toe regularly.  Monitor any moles. Be sure to tell your health care provider: ? About any new moles or changes in moles, especially if there is a change in a mole's shape or color. ? If you have a mole that is larger than the size of a pencil eraser.  If any of your family members has a history of skin cancer, especially at a young age, talk with your health care provider about genetic screening.  Always use sunscreen. Apply sunscreen liberally and repeatedly throughout the day.  Whenever you are outside, protect yourself by wearing long sleeves, pants, a wide-brimmed hat, and sunglasses.  What should I know about osteoporosis? Osteoporosis is a condition in which bone destruction happens more quickly than new bone creation. After menopause, you may be at an increased risk for osteoporosis. To help prevent osteoporosis or the bone fractures that can happen because of osteoporosis, the following is recommended:  If you are 59-72 years old, get at least 1,000 mg of calcium and at least 600 mg of vitamin D per day.  If you are older than age 107 but younger than age 30, get at least 1,200 mg of calcium and at least 600 mg of vitamin D per day.  If you are older than age 39, get at least 1,200 mg of calcium and at least 800 mg of vitamin D per day.  Smoking and excessive alcohol intake increase the risk of osteoporosis. Eat foods that are rich in calcium and vitamin D, and do weight-bearing exercises several times each week as directed by your health care provider. What should I know about how menopause  affects my mental health? Depression may occur at any age, but it is more common as you become older. Common symptoms of depression include:  Low or sad mood.  Changes in sleep patterns.  Changes in appetite or eating patterns.  Feeling an overall lack of motivation or enjoyment of activities that you previously enjoyed.  Frequent crying spells.  Talk with your health care provider if you think that you are experiencing depression. What should I know about immunizations? It is important that you get and maintain your immunizations. These include:  Tetanus, diphtheria, and pertussis (Tdap) booster vaccine.  Influenza every year before the flu season begins.  Pneumonia vaccine.  Shingles vaccine.  Your health care provider may also recommend other  immunizations. This information is not intended to replace advice given to you by your health care provider. Make sure you discuss any questions you have with your health care provider. Document Released: 01/29/2006 Document Revised: 06/26/2016 Document Reviewed: 09/10/2015 Elsevier Interactive Patient Education  2018 [employer].

## 2017-07-23 LAB — PTH, INTACT AND CALCIUM
CALCIUM: 10.6 mg/dL — AB (ref 8.6–10.4)
PTH: 65 pg/mL — AB (ref 14–64)

## 2017-07-24 ENCOUNTER — Encounter: Payer: Self-pay | Admitting: Family Medicine

## 2017-07-24 NOTE — Addendum Note (Signed)
Addended by: Lamar Blinks C on: 07/24/2017 01:53 PM   Modules accepted: Orders

## 2017-07-26 LAB — CYTOLOGY - PAP
Adequacy: ABSENT
Diagnosis: NEGATIVE

## 2017-10-13 ENCOUNTER — Ambulatory Visit (INDEPENDENT_AMBULATORY_CARE_PROVIDER_SITE_OTHER): Payer: BLUE CROSS/BLUE SHIELD | Admitting: Endocrinology

## 2017-10-13 ENCOUNTER — Encounter: Payer: Self-pay | Admitting: Endocrinology

## 2017-10-13 DIAGNOSIS — E210 Primary hyperparathyroidism: Secondary | ICD-10-CM

## 2017-10-13 LAB — PHOSPHORUS: Phosphorus: 2.6 mg/dL (ref 2.3–4.6)

## 2017-10-13 LAB — VITAMIN D 25 HYDROXY (VIT D DEFICIENCY, FRACTURES): VITD: 19.24 ng/mL — AB (ref 30.00–100.00)

## 2017-10-13 MED ORDER — ERGOCALCIFEROL 1.25 MG (50000 UT) PO CAPS: 50000.0000 [IU] | ORAL_CAPSULE | ORAL | 0 refills | Status: DC

## 2017-10-13 NOTE — Patient Instructions (Signed)
blood tests are requested for you today.  We'll let you know about the results.    Let's also check the bone-density test.   If the tests are fine, all we need to do is to follow this, so please come back for a follow-up appointment in 6 months.

## 2017-10-13 NOTE — Progress Notes (Signed)
Subjective:    Patient ID: Rebecca Levy, female    DOB: [birth year], 51 y.o.   MRN: [id number]  HPI Pt is referred by Dr Lorelei Pont, for hypercalcemia.  Pt was noted to have hypercalcemia in 2017.  she has never had osteoporosis, urolithiasis, parathyroid probs, sarcoidosis, cancer, PUD, pancreatitis, or depression.  she does not take vitamin-A supplement.  Pt denies taking Li++, or HCTZ.  Only bony fx was left tibia (2010).  She seldom takes Tums.  She has moderate pain at the lower back, but no assoc numbness.  Past Medical History:  Diagnosis Date  . GERD (gastroesophageal reflux disease)   . Hemorrhoids   . High cholesterol   . Vitamin D deficiency     Past Surgical History:  Procedure Laterality Date  . CERVICAL ABLATION  1990  . COLONOSCOPY    . ENDOMETRIAL ABLATION  01/21/2015  . TUBAL LIGATION  04/20/2001  . WISDOM TOOTH EXTRACTION      Social History   Social History  . Marital status: Divorced    Spouse name: N/A  . Number of children: N/A  . Years of education: N/A   Occupational History  . Not on file.   Social History Main Topics  . Smoking status: Never Smoker  . Smokeless tobacco: Never Used  . Alcohol use Yes     Comment: occ   . Drug use: No  . Sexual activity: Not on file   Other Topics Concern  . Not on file   Social History Narrative  . No narrative on file    Current Outpatient Prescriptions on File Prior to Visit  Medication Sig Dispense Refill  . Multiple Vitamins-Minerals (CENTRUM SILVER 50+WOMEN PO) Take 1 tablet by mouth daily.    . Omega-3 Fatty Acids (FISH OIL) 1000 MG CAPS Take by mouth daily.    Marland Kitchen VITAMIN D, ERGOCALCIFEROL, PO Take 1,000 mg by mouth daily.      No current facility-administered medications on file prior to visit.     No Known Allergies  Family History  Problem Relation Age of Onset  . Colon cancer Father        died age 73 due to colon cancer  . Hypertension Father   . Hyperlipidemia Father   . Colon  cancer Sister        died age 37 due to colon cancer  . Ovarian cancer Maternal Aunt   . Breast cancer Maternal Aunt   . Breast cancer Maternal Aunt   . Hyperparathyroidism Neg Hx     BP 132/88   Pulse 98   Wt 219 lb 12.8 oz (99.7 kg)   SpO2 97%   BMI 37.44 kg/m    Review of Systems Denies visual loss, edema, diarrhea, sore throat, polyuria, hematuria, rash, depression, seizure, and myalgias.   She has weight gain.  She has a slight cough.     Objective:   Physical Exam VS: see vs page GEN: no distress HEAD: head: no deformity eyes: no periorbital swelling, no proptosis external nose and ears are normal mouth: no lesion seen NECK: supple, thyroid is not enlarged CHEST WALL: no deformity.  No kyphosis LUNGS: clear to auscultation CV: reg rate and rhythm, no murmur ABD: abdomen is soft, nontender.  no hepatosplenomegaly.  not distended.  no hernia MUSCULOSKELETAL: muscle bulk and strength are grossly normal.  no obvious joint swelling.  gait is normal and steady EXTEMITIES: no deformity.  no edema PULSES: no carotid bruit NEURO:  cn 2-12  grossly intact.   readily moves all 4's.  sensation is intact to touch on all 4's SKIN:  Normal texture and temperature.  No rash or suspicious lesion is visible.   NODES:  None palpable at the neck PSYCH: alert, well-oriented.  Does not appear anxious nor depressed.    Lab Results  Component Value Date   PTH 65 (H) 07/22/2017   CALCIUM 10.6 (H) 07/22/2017   I have reviewed outside records, and summarized: Pt was noted to have elevated Ca++, and referred here.  She was seen for CPX, and h/o hypercalcemia was noted.  PTH was checked.      Assessment & Plan:  Hyperparathyroidism, new to me, prob primary.    Patient Instructions  blood tests are requested for you today.  We'll let you know about the results.    Let's also check the bone-density test.   If the tests are fine, all we need to do is to follow this, so please come back  for a follow-up appointment in 6 months.

## 2017-10-14 ENCOUNTER — Ambulatory Visit (INDEPENDENT_AMBULATORY_CARE_PROVIDER_SITE_OTHER)
Admission: RE | Admit: 2017-10-14 | Discharge: 2017-10-14 | Disposition: A | Discharge status: Home / Self Care | Payer: BLUE CROSS/BLUE SHIELD | Source: Ambulatory Visit | Attending: Endocrinology | Admitting: Endocrinology

## 2017-10-14 DIAGNOSIS — E210 Primary hyperparathyroidism: Secondary | ICD-10-CM | POA: Diagnosis not present

## 2017-10-20 LAB — VITAMIN D 1,25 DIHYDROXY
VITAMIN D 1, 25 (OH) TOTAL: 73 pg/mL — AB (ref 18–72)
VITAMIN D3 1, 25 (OH): 73 pg/mL

## 2017-10-20 LAB — TIQ-NTM

## 2017-10-20 LAB — PTH, INTACT AND CALCIUM
CALCIUM: 10.4 mg/dL (ref 8.6–10.4)
PTH: 72 pg/mL — AB (ref 14–64)

## 2017-10-20 LAB — COPPER, FREE

## 2017-10-20 LAB — PTH-RELATED PEPTIDE: PTH-RELATED PROTEIN (PTH-RP): 10 pg/mL — AB (ref 14–27)

## 2017-10-20 LAB — VITAMIN A: Vitamin A (Retinoic Acid): 44 ug/dL (ref 38–98)

## 2017-11-08 ENCOUNTER — Telehealth: Payer: Self-pay | Admitting: Endocrinology

## 2017-11-09 ENCOUNTER — Other Ambulatory Visit (INDEPENDENT_AMBULATORY_CARE_PROVIDER_SITE_OTHER): Payer: BLUE CROSS/BLUE SHIELD

## 2017-11-09 DIAGNOSIS — E210 Primary hyperparathyroidism: Secondary | ICD-10-CM | POA: Diagnosis not present

## 2017-11-09 LAB — VITAMIN D 25 HYDROXY (VIT D DEFICIENCY, FRACTURES): VITD: 39.97 ng/mL (ref 30.00–100.00)

## 2017-11-12 LAB — PTH, INTACT AND CALCIUM
Calcium: 10.8 mg/dL — ABNORMAL HIGH (ref 8.6–10.4)
PTH: 86 pg/mL — ABNORMAL HIGH (ref 14–64)

## 2017-11-16 ENCOUNTER — Other Ambulatory Visit: Payer: BLUE CROSS/BLUE SHIELD

## 2018-01-12 ENCOUNTER — Other Ambulatory Visit: Payer: Self-pay | Admitting: Family Medicine

## 2018-01-12 DIAGNOSIS — Z1231 Encounter for screening mammogram for malignant neoplasm of breast: Secondary | ICD-10-CM

## 2018-02-08 ENCOUNTER — Ambulatory Visit: Payer: BLUE CROSS/BLUE SHIELD

## 2018-02-17 ENCOUNTER — Ambulatory Visit
Admission: RE | Admit: 2018-02-17 | Discharge: 2018-02-17 | Disposition: A | Discharge status: Home / Self Care | Payer: BLUE CROSS/BLUE SHIELD | Source: Ambulatory Visit | Attending: Family Medicine | Admitting: Family Medicine

## 2018-02-17 DIAGNOSIS — Z1231 Encounter for screening mammogram for malignant neoplasm of breast: Secondary | ICD-10-CM

## 2018-04-18 ENCOUNTER — Encounter: Payer: Self-pay | Admitting: Endocrinology

## 2018-04-18 ENCOUNTER — Ambulatory Visit (INDEPENDENT_AMBULATORY_CARE_PROVIDER_SITE_OTHER): Payer: BLUE CROSS/BLUE SHIELD | Admitting: Endocrinology

## 2018-04-18 DIAGNOSIS — E213 Hyperparathyroidism, unspecified: Secondary | ICD-10-CM

## 2018-04-18 LAB — VITAMIN D 25 HYDROXY (VIT D DEFICIENCY, FRACTURES): VITD: 27.51 ng/mL — AB (ref 30.00–100.00)

## 2018-04-18 NOTE — Patient Instructions (Addendum)
blood tests are requested for you today.  We'll let you know about the results.    If there is no change, please come back for a follow-up appointment in 6 months.

## 2018-04-18 NOTE — Progress Notes (Signed)
Subjective:    Patient ID: Rebecca Levy, female    DOB: [birth year], 52 y.o.   MRN: [id number]  HPI pt returns for f/u of hypercalcemia (dx'ed 2017; no cause was found, except PTH has been slightly high; she also has vit-D def and osteopenia;  Hyperparathyroidism, new to me, prob primary).  pt states she feels well in general, except for pain at the mid and lower back.  She takes vit-D, 1000 units qd.   Past Medical History:  Diagnosis Date  . GERD (gastroesophageal reflux disease)   . Hemorrhoids   . High cholesterol   . Vitamin D deficiency     Past Surgical History:  Procedure Laterality Date  . CERVICAL ABLATION  1990  . COLONOSCOPY    . ENDOMETRIAL ABLATION  01/21/2015  . TUBAL LIGATION  04/20/2001  . WISDOM TOOTH EXTRACTION      Social History   Socioeconomic History  . Marital status: Divorced    Spouse name: Not on file  . Number of children: Not on file  . Years of education: Not on file  . Highest education level: Not on file  Occupational History  . Not on file  Social Needs  . Financial resource strain: Not on file  . Food insecurity:    Worry: Not on file    Inability: Not on file  . Transportation needs:    Medical: Not on file    Non-medical: Not on file  Tobacco Use  . Smoking status: Never Smoker  . Smokeless tobacco: Never Used  Substance and Sexual Activity  . Alcohol use: Yes    Comment: occ   . Drug use: No  . Sexual activity: Not on file  Lifestyle  . Physical activity:    Days per week: Not on file    Minutes per session: Not on file  . Stress: Not on file  Relationships  . Social connections:    Talks on phone: Not on file    Gets together: Not on file    Attends religious service: Not on file    Active member of club or organization: Not on file    Attends meetings of clubs or organizations: Not on file    Relationship status: Not on file  . Intimate partner violence:    Fear of current or ex partner: Not on file   Emotionally abused: Not on file    Physically abused: Not on file    Forced sexual activity: Not on file  Other Topics Concern  . Not on file  Social History Narrative  . Not on file    Current Outpatient Medications on File Prior to Visit  Medication Sig Dispense Refill  . Omega-3 Fatty Acids (FISH OIL) 1000 MG CAPS Take by mouth daily.    Marland Kitchen VITAMIN D, ERGOCALCIFEROL, PO Take 1,000 mg by mouth daily.     . Multiple Vitamins-Minerals (CENTRUM SILVER 50+WOMEN PO) Take 1 tablet by mouth daily.     No current facility-administered medications on file prior to visit.     No Known Allergies  Family History  Problem Relation Age of Onset  . Colon cancer Father        died age 70 due to colon cancer  . Hypertension Father   . Hyperlipidemia Father   . Colon cancer Sister        died age 54 due to colon cancer  . Ovarian cancer Maternal Aunt   . Breast cancer Maternal Aunt   .  Breast cancer Maternal Aunt   . Hyperparathyroidism Neg Hx     BP 128/86   Pulse 74   Wt 213 lb (96.6 kg)   SpO2 97%   BMI 36.28 kg/m     Review of Systems Denies hematuria.    Objective:   Physical Exam VITAL SIGNS:  See vs Rebecca GENERAL: no distress Spine: no kyphosis   Lab Results  Component Value Date   PTH 69 (H) 04/18/2018   CALCIUM 11.3 (H) 04/18/2018   PHOS 2.6 10/13/2017   Vit-D=28    Assessment & Plan:  Hyperparathyroidism: uncertain if primary or secondary Vit-D def: she needs increased rx Hypercalcemia: we need to normalize vit-D to eval cause.  Patient Instructions  blood tests are requested for you today.  We'll let you know about the results.    If there is no change, please come back for a follow-up appointment in 6 months.

## 2018-04-19 LAB — PTH, INTACT AND CALCIUM
Calcium: 11.3 mg/dL — ABNORMAL HIGH (ref 8.6–10.4)
PTH: 69 pg/mL — ABNORMAL HIGH (ref 14–64)

## 2018-04-29 ENCOUNTER — Emergency Department (HOSPITAL_COMMUNITY)
Admission: EM | Admit: 2018-04-29 | Discharge: 2018-04-29 | Disposition: A | Discharge status: Home / Self Care | Payer: BLUE CROSS/BLUE SHIELD | Attending: Emergency Medicine | Admitting: Emergency Medicine

## 2018-04-29 ENCOUNTER — Emergency Department (HOSPITAL_COMMUNITY): Payer: BLUE CROSS/BLUE SHIELD

## 2018-04-29 ENCOUNTER — Encounter (HOSPITAL_COMMUNITY): Payer: Self-pay | Admitting: *Deleted

## 2018-04-29 DIAGNOSIS — Y92410 Unspecified street and highway as the place of occurrence of the external cause: Secondary | ICD-10-CM | POA: Insufficient documentation

## 2018-04-29 DIAGNOSIS — Y939 Activity, unspecified: Secondary | ICD-10-CM | POA: Diagnosis not present

## 2018-04-29 DIAGNOSIS — M79651 Pain in right thigh: Secondary | ICD-10-CM | POA: Insufficient documentation

## 2018-04-29 DIAGNOSIS — M25551 Pain in right hip: Secondary | ICD-10-CM | POA: Insufficient documentation

## 2018-04-29 DIAGNOSIS — Y999 Unspecified external cause status: Secondary | ICD-10-CM | POA: Insufficient documentation

## 2018-04-29 MED ORDER — CYCLOBENZAPRINE HCL 10 MG PO TABS: 10.0000 mg | ORAL_TABLET | Freq: Two times a day (BID) | ORAL | 0 refills | Status: DC | PRN

## 2018-04-29 MED ORDER — IBUPROFEN 800 MG PO TABS
800.0000 mg | ORAL_TABLET | Freq: Once | ORAL | Status: AC
  Administered 2018-04-29: 800 mg via ORAL
  Filled 2018-04-29: qty 1

## 2018-04-29 MED ORDER — IBUPROFEN 600 MG PO TABS: 600.0000 mg | ORAL_TABLET | Freq: Four times a day (QID) | ORAL | 0 refills | Status: DC | PRN
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
## 2018-05-04 ENCOUNTER — Ambulatory Visit: Payer: BLUE CROSS/BLUE SHIELD | Admitting: Family Medicine

## 2018-05-04 ENCOUNTER — Encounter: Payer: Self-pay | Admitting: Family Medicine

## 2018-05-04 VITALS — BP 124/88 | HR 86 | Resp 16 | Ht 64.0 in | Wt 212.0 lb

## 2018-05-04 DIAGNOSIS — M25551 Pain in right hip: Secondary | ICD-10-CM | POA: Diagnosis not present

## 2018-05-04 DIAGNOSIS — M545 Low back pain, unspecified: Secondary | ICD-10-CM

## 2018-05-04 MED ORDER — MELOXICAM 15 MG PO TABS: 15.0000 mg | ORAL_TABLET | Freq: Every day | ORAL | 0 refills | Status: DC

## 2018-05-04 NOTE — Patient Instructions (Signed)
I am sorry that you got hurt!  I am hopeful however that you will be significantly better soon Continue to use heat, and flexeril as needed I will also give you meloxicam to use instead of ibuprofen for inflammation and pain- take this once a day Seeing a chiropractor is also certainly an ok idea  Take care and let me know if you are not nearly well or back to normal by the start of next week
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
## 2018-07-25 ENCOUNTER — Encounter: Payer: Self-pay | Admitting: Family Medicine

## 2018-07-25 ENCOUNTER — Ambulatory Visit (INDEPENDENT_AMBULATORY_CARE_PROVIDER_SITE_OTHER): Payer: BLUE CROSS/BLUE SHIELD | Admitting: Family Medicine

## 2018-07-25 VITALS — BP 112/82 | HR 91 | Resp 16 | Ht 64.0 in | Wt 211.0 lb

## 2018-07-25 DIAGNOSIS — Z130 Encounter for screening for diseases of the blood and blood-forming organs and certain disorders involving the immune mechanism: Secondary | ICD-10-CM

## 2018-07-25 DIAGNOSIS — M25742 Osteophyte, left hand: Secondary | ICD-10-CM

## 2018-07-25 DIAGNOSIS — E210 Primary hyperparathyroidism: Secondary | ICD-10-CM | POA: Diagnosis not present

## 2018-07-25 DIAGNOSIS — Z131 Encounter for screening for diabetes mellitus: Secondary | ICD-10-CM

## 2018-07-25 DIAGNOSIS — Z0000 Encounter for general adult medical examination without abnormal findings: Secondary | ICD-10-CM

## 2018-07-25 DIAGNOSIS — Z13220 Encounter for screening for lipoid disorders: Secondary | ICD-10-CM

## 2018-07-25 DIAGNOSIS — Z205 Contact with and (suspected) exposure to viral hepatitis: Secondary | ICD-10-CM

## 2018-07-25 LAB — LIPID PANEL
Cholesterol: 205 mg/dL — ABNORMAL HIGH (ref 0–200)
HDL: 36.8 mg/dL — AB (ref >39.00)
NONHDL: 168.53
Total CHOL/HDL Ratio: 6
Triglycerides: 307 mg/dL — ABNORMAL HIGH (ref 0.0–149.0)
VLDL: 61.4 mg/dL — ABNORMAL HIGH (ref 0.0–40.0)

## 2018-07-25 LAB — HEMOGLOBIN A1C: HEMOGLOBIN A1C: 5.3 % (ref 4.6–6.5)

## 2018-07-25 LAB — COMPREHENSIVE METABOLIC PANEL
ALBUMIN: 4.1 g/dL (ref 3.5–5.2)
ALK PHOS: 91 U/L (ref 39–117)
ALT: 22 U/L (ref 0–35)
AST: 14 U/L (ref 0–37)
BILIRUBIN TOTAL: 0.6 mg/dL (ref 0.2–1.2)
BUN: 13 mg/dL (ref 6–23)
CO2: 27 mEq/L (ref 19–32)
CREATININE: 0.91 mg/dL (ref 0.40–1.20)
Calcium: 11.7 mg/dL — ABNORMAL HIGH (ref 8.4–10.5)
Chloride: 104 mEq/L (ref 96–112)
GFR: 83.54 mL/min (ref >60.00)
GLUCOSE: 84 mg/dL (ref 70–99)
Potassium: 4.5 mEq/L (ref 3.5–5.1)
SODIUM: 138 meq/L (ref 135–145)
Total Protein: 7.3 g/dL (ref 6.0–8.3)

## 2018-07-25 LAB — LDL CHOLESTEROL, DIRECT: Direct LDL: 132 mg/dL

## 2018-07-25 LAB — CBC
HEMATOCRIT: 38 % (ref 36.0–46.0)
Hemoglobin: 12.5 g/dL (ref 12.0–15.0)
MCHC: 33 g/dL (ref 30.0–36.0)
MCV: 91.3 fl (ref 78.0–100.0)
Platelets: 337 10*3/uL (ref 150.0–400.0)
RBC: 4.16 Mil/uL (ref 3.87–5.11)
RDW: 13.3 % (ref 11.5–15.5)
WBC: 6.4 10*3/uL (ref 4.0–10.5)

## 2018-07-25 LAB — VITAMIN D 25 HYDROXY (VIT D DEFICIENCY, FRACTURES): VITD: 47.29 ng/mL (ref 30.00–100.00)

## 2018-07-25 NOTE — Patient Instructions (Addendum)
Great to see you today-  I will be in touch with your labs asap  We will set you up to see hand surgery about your left thumb  Do try and work on exercise as you are able!    Health Maintenance for Postmenopausal Women Menopause is a normal process in which your reproductive ability comes to an end. This process happens gradually over a span of months to years, usually between the ages of 38 and 74. Menopause is complete when you have missed 12 consecutive menstrual periods. It is important to talk with your health care provider about some of the most common conditions that affect postmenopausal women, such as heart disease, cancer, and bone loss (osteoporosis). Adopting a healthy lifestyle and getting preventive care can help to promote your health and wellness. Those actions can also lower your chances of developing some of these common conditions. What should I know about menopause? During menopause, you may experience a number of symptoms, such as:  Moderate-to-severe hot flashes.  Night sweats.  Decrease in sex drive.  Mood swings.  Headaches.  Tiredness.  Irritability.  Memory problems.  Insomnia.  Choosing to treat or not to treat menopausal changes is an individual decision that you make with your health care provider. What should I know about hormone replacement therapy and supplements? Hormone therapy products are effective for treating symptoms that are associated with menopause, such as hot flashes and night sweats. Hormone replacement carries certain risks, especially as you become older. If you are thinking about using estrogen or estrogen with progestin treatments, discuss the benefits and risks with your health care provider. What should I know about heart disease and stroke? Heart disease, heart attack, and stroke become more likely as you age. This may be due, in part, to the hormonal changes that your body experiences during menopause. These can affect how your  body processes dietary fats, triglycerides, and cholesterol. Heart attack and stroke are both medical emergencies. There are many things that you can do to help prevent heart disease and stroke:  Have your blood pressure checked at least every 1-2 years. High blood pressure causes heart disease and increases the risk of stroke.  If you are 22-38 years old, ask your health care provider if you should take aspirin to prevent a heart attack or a stroke.  Do not use any tobacco products, including cigarettes, chewing tobacco, or electronic cigarettes. If you need help quitting, ask your health care provider.  It is important to eat a healthy diet and maintain a healthy weight. ? Be sure to include plenty of vegetables, fruits, low-fat dairy products, and lean protein. ? Avoid eating foods that are high in solid fats, added sugars, or salt (sodium).  Get regular exercise. This is one of the most important things that you can do for your health. ? Try to exercise for at least 150 minutes each week. The type of exercise that you do should increase your heart rate and make you sweat. This is known as moderate-intensity exercise. ? Try to do strengthening exercises at least twice each week. Do these in addition to the moderate-intensity exercise.  Know your numbers.Ask your health care provider to check your cholesterol and your blood glucose. Continue to have your blood tested as directed by your health care provider.  What should I know about cancer screening? There are several types of cancer. Take the following steps to reduce your risk and to catch any cancer development as early as possible.  Breast Cancer  Practice breast self-awareness. ? This means understanding how your breasts normally appear and feel. ? It also means doing regular breast self-exams. Let your health care provider know about any changes, no matter how small.  If you are 77 or older, have a clinician do a breast exam  (clinical breast exam or CBE) every year. Depending on your age, family history, and medical history, it may be recommended that you also have a yearly breast X-ray (mammogram).  If you have a family history of breast cancer, talk with your health care provider about genetic screening.  If you are at high risk for breast cancer, talk with your health care provider about having an MRI and a mammogram every year.  Breast cancer (BRCA) gene test is recommended for women who have family members with BRCA-related cancers. Results of the assessment will determine the need for genetic counseling and BRCA1 and for BRCA2 testing. BRCA-related cancers include these types: ? Breast. This occurs in males or females. ? Ovarian. ? Tubal. This may also be called fallopian tube cancer. ? Cancer of the abdominal or pelvic lining (peritoneal cancer). ? Prostate. ? Pancreatic.  Cervical, Uterine, and Ovarian Cancer Your health care provider may recommend that you be screened regularly for cancer of the pelvic organs. These include your ovaries, uterus, and vagina. This screening involves a pelvic exam, which includes checking for microscopic changes to the surface of your cervix (Pap test).  For women ages 21-65, health care providers may recommend a pelvic exam and a Pap test every three years. For women ages 65-65, they may recommend the Pap test and pelvic exam, combined with testing for human papilloma virus (HPV), every five years. Some types of HPV increase your risk of cervical cancer. Testing for HPV may also be done on women of any age who have unclear Pap test results.  Other health care providers may not recommend any screening for nonpregnant women who are considered low risk for pelvic cancer and have no symptoms. Ask your health care provider if a screening pelvic exam is right for you.  If you have had past treatment for cervical cancer or a condition that could lead to cancer, you need Pap tests  and screening for cancer for at least 20 years after your treatment. If Pap tests have been discontinued for you, your risk factors (such as having a new sexual partner) need to be reassessed to determine if you should start having screenings again. Some women have medical problems that increase the chance of getting cervical cancer. In these cases, your health care provider may recommend that you have screening and Pap tests more often.  If you have a family history of uterine cancer or ovarian cancer, talk with your health care provider about genetic screening.  If you have vaginal bleeding after reaching menopause, tell your health care provider.  There are currently no reliable tests available to screen for ovarian cancer.  Lung Cancer Lung cancer screening is recommended for adults 7-53 years old who are at high risk for lung cancer because of a history of smoking. A yearly low-dose CT scan of the lungs is recommended if you:  Currently smoke.  Have a history of at least 30 pack-years of smoking and you currently smoke or have quit within the past 15 years. A pack-year is smoking an average of one pack of cigarettes per day for one year.  Yearly screening should:  Continue until it has been 15 years since  you quit.  Stop if you develop a health problem that would prevent you from having lung cancer treatment.  Colorectal Cancer  This type of cancer can be detected and can often be prevented.  Routine colorectal cancer screening usually begins at age 73 and continues through age 75.  If you have risk factors for colon cancer, your health care provider may recommend that you be screened at an earlier age.  If you have a family history of colorectal cancer, talk with your health care provider about genetic screening.  Your health care provider may also recommend using home test kits to check for hidden blood in your stool.  A small camera at the end of a tube can be used to  examine your colon directly (sigmoidoscopy or colonoscopy). This is done to check for the earliest forms of colorectal cancer.  Direct examination of the colon should be repeated every 5-10 years until age 3. However, if early forms of precancerous polyps or small growths are found or if you have a family history or genetic risk for colorectal cancer, you may need to be screened more often.  Skin Cancer  Check your skin from head to toe regularly.  Monitor any moles. Be sure to tell your health care provider: ? About any new moles or changes in moles, especially if there is a change in a mole's shape or color. ? If you have a mole that is larger than the size of a pencil eraser.  If any of your family members has a history of skin cancer, especially at a young age, talk with your health care provider about genetic screening.  Always use sunscreen. Apply sunscreen liberally and repeatedly throughout the day.  Whenever you are outside, protect yourself by wearing long sleeves, pants, a wide-brimmed hat, and sunglasses.  What should I know about osteoporosis? Osteoporosis is a condition in which bone destruction happens more quickly than new bone creation. After menopause, you may be at an increased risk for osteoporosis. To help prevent osteoporosis or the bone fractures that can happen because of osteoporosis, the following is recommended:  If you are 32-62 years old, get at least 1,000 mg of calcium and at least 600 mg of vitamin D per day.  If you are older than age 72 but younger than age 45, get at least 1,200 mg of calcium and at least 600 mg of vitamin D per day.  If you are older than age 90, get at least 1,200 mg of calcium and at least 800 mg of vitamin D per day.  Smoking and excessive alcohol intake increase the risk of osteoporosis. Eat foods that are rich in calcium and vitamin D, and do weight-bearing exercises several times each week as directed by your health care  provider. What should I know about how menopause affects my mental health? Depression may occur at any age, but it is more common as you become older. Common symptoms of depression include:  Low or sad mood.  Changes in sleep patterns.  Changes in appetite or eating patterns.  Feeling an overall lack of motivation or enjoyment of activities that you previously enjoyed.  Frequent crying spells.  Talk with your health care provider if you think that you are experiencing depression. What should I know about immunizations? It is important that you get and maintain your immunizations. These include:  Tetanus, diphtheria, and pertussis (Tdap) booster vaccine.  Influenza every year before the flu season begins.  Pneumonia vaccine.  Shingles  vaccine.  Your health care provider may also recommend other immunizations. This information is not intended to replace advice given to you by your health care provider. Make sure you discuss any questions you have with your health care provider. Document Released: 01/29/2006 Document Revised: 06/26/2016 Document Reviewed: 09/10/2015 Elsevier Interactive Patient Education  2018 [employer].

## 2018-07-26 LAB — HEPATITIS B SURFACE ANTIBODY, QUANTITATIVE: Hepatitis B-Post: 845 m[IU]/mL (ref >=10)

## 2018-07-26 LAB — PARATHYROID HORMONE, INTACT (NO CA): PTH: 52 pg/mL (ref 14–64)

## 2018-08-06 ENCOUNTER — Other Ambulatory Visit: Payer: Self-pay | Admitting: Orthopedic Surgery

## 2018-08-06 ENCOUNTER — Other Ambulatory Visit (HOSPITAL_COMMUNITY): Payer: Self-pay | Admitting: Orthopedic Surgery

## 2018-08-06 DIAGNOSIS — R2232 Localized swelling, mass and lump, left upper limb: Secondary | ICD-10-CM

## 2018-08-18 ENCOUNTER — Ambulatory Visit (INDEPENDENT_AMBULATORY_CARE_PROVIDER_SITE_OTHER): Payer: BLUE CROSS/BLUE SHIELD | Admitting: Endocrinology

## 2018-08-18 ENCOUNTER — Ambulatory Visit: Payer: BLUE CROSS/BLUE SHIELD | Admitting: Endocrinology

## 2018-08-18 ENCOUNTER — Encounter: Payer: Self-pay | Admitting: Endocrinology

## 2018-08-18 NOTE — Patient Instructions (Addendum)
blood tests, and a 24-HR urine test, are requested for you today.  We'll let you know about the results.    If there is no change, please come back for a follow-up appointment in 6 months.

## 2018-08-18 NOTE — Progress Notes (Signed)
Subjective:    Patient ID: Rebecca Levy, female    DOB: January 26, 1966, 52 y.o.   MRN: [id number]  HPI pt returns for f/u of hypercalcemia (dx'ed 2017; no cause was found: 1,25 vit-D was borderline high; these were normal: PTHrP, vit-A, and AP; she also has vit-D def and osteopenia); denies falls.  Pt has ongoing pain at the lower back, but no assoc muscle cramps.  she takes vit-D, 2000 units/day.   Past Medical History:  Diagnosis Date  . GERD (gastroesophageal reflux disease)   . Hemorrhoids   . High cholesterol   . Vitamin D deficiency     Past Surgical History:  Procedure Laterality Date  . CERVICAL ABLATION  1990  . COLONOSCOPY    . ENDOMETRIAL ABLATION  01/21/2015  . TUBAL LIGATION  04/20/2001  . WISDOM TOOTH EXTRACTION      Social History   Socioeconomic History  . Marital status: Divorced    Spouse name: Not on file  . Number of children: Not on file  . Years of education: Not on file  . Highest education level: Not on file  Occupational History  . Not on file  Social Needs  . Financial resource strain: Not on file  . Food insecurity:    Worry: Not on file    Inability: Not on file  . Transportation needs:    Medical: Not on file    Non-medical: Not on file  Tobacco Use  . Smoking status: Never Smoker  . Smokeless tobacco: Never Used  Substance and Sexual Activity  . Alcohol use: Yes    Comment: occ   . Drug use: No  . Sexual activity: Not on file  Lifestyle  . Physical activity:    Days per week: Not on file    Minutes per session: Not on file  . Stress: Not on file  Relationships  . Social connections:    Talks on phone: Not on file    Gets together: Not on file    Attends religious service: Not on file    Active member of club or organization: Not on file    Attends meetings of clubs or organizations: Not on file    Relationship status: Not on file  . Intimate partner violence:    Fear of current or ex partner: Not on file    Emotionally  abused: Not on file    Physically abused: Not on file    Forced sexual activity: Not on file  Other Topics Concern  . Not on file  Social History Narrative  . Not on file    Current Outpatient Medications on File Prior to Visit  Medication Sig Dispense Refill  . Omega-3 Fatty Acids (OMEGA 3 500 PO) Take by mouth.    . TURMERIC PO Take by mouth.    Marland Kitchen VITAMIN D, ERGOCALCIFEROL, PO Take 1,000 mg by mouth 2 (two) times daily.      No current facility-administered medications on file prior to visit.     No Known Allergies  Family History  Problem Relation Age of Onset  . Colon cancer Father        died age 35 due to colon cancer  . Hypertension Father   . Hyperlipidemia Father   . Colon cancer Sister        died age 44 due to colon cancer  . Ovarian cancer Maternal Aunt   . Breast cancer Maternal Aunt   . Breast cancer Maternal Aunt   .  Hyperparathyroidism Neg Hx     BP 126/78   Pulse 91   Ht 5\' 4"  (1.626 m)   Wt 210 lb 6.4 oz (95.4 kg)   LMP 08/27/2017 (Approximate)   BMI 36.12 kg/m    Review of Systems Denies numbness.  Arthralgias are unchanged.      Objective:   Physical Exam VITAL SIGNS:  See vs page GENERAL: no distress NECK: There is no palpable thyroid enlargement.  No thyroid nodule is palpable.  No palpable lymphadenopathy at the anterior neck.  Lab Results  Component Value Date   PTH 52 07/25/2018   CALCIUM 11.7 (H) 07/25/2018   PHOS 2.6 10/13/2017   Lab Results  Component Value Date   ALT 22 07/25/2018   AST 14 07/25/2018   ALKPHOS 91 07/25/2018   BILITOT 0.6 07/25/2018   25-OH vit-D=47    Assessment & Plan:  Hypercalcemia: persistent, despite normalization of PTH Vit-D def: well-replaced Arthralgia: this could be related to hypercalcemia--we'll follow  Patient Instructions  blood tests, and a 24-HR urine test, are requested for you today.  We'll let you know about the results.    If there is no change, please come back for a follow-up  appointment in 6 months.

## 2018-08-19 ENCOUNTER — Other Ambulatory Visit (INDEPENDENT_AMBULATORY_CARE_PROVIDER_SITE_OTHER): Payer: BLUE CROSS/BLUE SHIELD

## 2018-08-19 LAB — VITAMIN D 25 HYDROXY (VIT D DEFICIENCY, FRACTURES): VITD: 46.84 ng/mL (ref 30.00–100.00)

## 2018-08-23 ENCOUNTER — Telehealth: Payer: Self-pay | Admitting: *Deleted

## 2018-08-23 LAB — PTH, INTACT AND CALCIUM
Calcium: 10.8 mg/dL — ABNORMAL HIGH (ref 8.6–10.4)
PTH: 97 pg/mL — AB (ref 14–64)

## 2018-08-23 NOTE — Telephone Encounter (Signed)
Received request for Medical Records from [facility], forwarded to [place] for email/scan/SLS 09/03

## 2018-08-24 ENCOUNTER — Ambulatory Visit
Admission: RE | Admit: 2018-08-24 | Discharge: 2018-08-24 | Disposition: A | Discharge status: Home / Self Care | Payer: BLUE CROSS/BLUE SHIELD | Source: Ambulatory Visit | Attending: Orthopedic Surgery | Admitting: Orthopedic Surgery

## 2018-08-24 DIAGNOSIS — R2232 Localized swelling, mass and lump, left upper limb: Secondary | ICD-10-CM

## 2018-08-24 MED ORDER — GADOBENATE DIMEGLUMINE 529 MG/ML IV SOLN
19.0000 mL | Freq: Once | INTRAVENOUS | Status: AC | PRN
  Administered 2018-08-24: 19 mL via INTRAVENOUS

## 2018-08-25 LAB — CALCIUM, URINE, 24 HOUR: Calcium, 24H Urine: 362 mg/24 h — ABNORMAL HIGH

## 2018-08-25 LAB — EXTRA URINE SPECIMEN

## 2018-10-18 ENCOUNTER — Ambulatory Visit: Payer: BLUE CROSS/BLUE SHIELD | Admitting: Endocrinology
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
## 2019-02-20 ENCOUNTER — Ambulatory Visit: Payer: BLUE CROSS/BLUE SHIELD | Admitting: Endocrinology

## 2019-02-20 ENCOUNTER — Telehealth: Payer: Self-pay | Admitting: Endocrinology

## 2019-02-20 NOTE — Telephone Encounter (Signed)
Patient no showed today's appt. Please advise on how to follow up. °A. No follow up necessary. °B. Follow up urgent. Contact patient immediately. °C. Follow up necessary. Contact patient and schedule visit in ___ days. °D. Follow up advised. Contact patient and schedule visit in ____weeks. ° °Would you like the NS fee to be applied to this visit? ° °

## 2019-02-20 NOTE — Telephone Encounter (Signed)
Please schedule f/u appt for next available appointment  

## 2019-02-20 NOTE — Telephone Encounter (Signed)
Please advise 

## 2019-02-21 NOTE — Telephone Encounter (Signed)
LMTCB to reschedule missed appointment °

## 2019-02-21 NOTE — Telephone Encounter (Signed)
Please refer to Dr. Ellison's response 

## 2019-07-27 ENCOUNTER — Encounter: Payer: BLUE CROSS/BLUE SHIELD | Admitting: Family Medicine
